# Patient Record
Sex: Male | Born: 1935 | Race: Black or African American | Hispanic: No | Smoking: Never smoker
Health system: Southern US, Community
[De-identification: ages and names within clinical notes are randomized; demographics above are authoritative.]

## PROBLEM LIST (undated history)

## (undated) DIAGNOSIS — F0390 Unspecified dementia without behavioral disturbance: Secondary | ICD-10-CM

## (undated) DIAGNOSIS — I10 Essential (primary) hypertension: Secondary | ICD-10-CM

## (undated) DIAGNOSIS — E7800 Pure hypercholesterolemia, unspecified: Secondary | ICD-10-CM

## (undated) DIAGNOSIS — M1990 Unspecified osteoarthritis, unspecified site: Secondary | ICD-10-CM

## (undated) HISTORY — DX: Unspecified dementia, unspecified severity, without behavioral disturbance, psychotic disturbance, mood disturbance, and anxiety: F03.90

---

## 2013-01-05 ENCOUNTER — Encounter: Payer: Self-pay | Admitting: Podiatry

## 2013-01-05 ENCOUNTER — Ambulatory Visit (INDEPENDENT_AMBULATORY_CARE_PROVIDER_SITE_OTHER): Payer: Medicare Other | Admitting: Podiatry

## 2013-01-05 VITALS — BP 145/66 | HR 76 | Ht 64.0 in | Wt 186.0 lb

## 2013-01-05 DIAGNOSIS — B351 Tinea unguium: Secondary | ICD-10-CM | POA: Insufficient documentation

## 2013-01-05 DIAGNOSIS — M25579 Pain in unspecified ankle and joints of unspecified foot: Secondary | ICD-10-CM | POA: Insufficient documentation

## 2013-01-05 NOTE — Patient Instructions (Addendum)
Debrided all nails and calluses. Return in 3 months or as needed. 

## 2013-01-05 NOTE — Progress Notes (Signed)
Subjective: 79 y.o. year old male accompanied by his wife presents complaining of painful nails. Patient requests toe nails, corns and calluses trimmed.   Review of Systems - General ROS: negative for - chills, fever, night sweats, sleep disturbance, weight gain or weight loss  Objective: Dermatologic: Thick yellow deformed nails x 10.  Plantar callus under both heels.  Vascular: Pedal pulses are not palpable bilateral. Orthopedic: rectus foot without gross deformity bilateral.  Neurologic: All epicritic and tactile sensations grossly intact.  Assessment: Dystrophic mycotic nails x 10.  Treatment: All mycotic nails, corns, calluses debrided.  Return in 3 months or as needed.

## 2013-07-07 ENCOUNTER — Encounter: Payer: Self-pay | Admitting: Podiatry

## 2013-07-07 ENCOUNTER — Ambulatory Visit (INDEPENDENT_AMBULATORY_CARE_PROVIDER_SITE_OTHER): Payer: Medicare Other | Admitting: Podiatry

## 2013-07-07 VITALS — BP 139/63 | HR 75 | Ht 64.0 in | Wt 193.0 lb

## 2013-07-07 DIAGNOSIS — B351 Tinea unguium: Secondary | ICD-10-CM

## 2013-07-07 DIAGNOSIS — M25579 Pain in unspecified ankle and joints of unspecified foot: Secondary | ICD-10-CM

## 2013-07-07 NOTE — Progress Notes (Signed)
Subjective:  78 y.o. year old male accompanied by his wife presents complaining of painful nails. Patient requests toe nails, corns and calluses trimmed.   Review of Systems - General ROS: negative for - chills, fever, night sweats, sleep disturbance, weight gain or weight loss   Objective: Dermatologic: Thick yellow deformed nails x 10.  Plantar callus under both heels.  Vascular: Dorsalis pedis pulses are not palpable bilateral. Both Tibialis posterior arteries are palpable.  No edema or erythema noted. Orthopedic: rectus foot without gross deformity bilateral.  Neurologic: All epicritic and tactile sensations grossly intact.   Assessment:  Dystrophic mycotic nails x 10.   Treatment: All mycotic nails, corns, calluses debrided.  Return in 3 months or as needed.

## 2013-07-07 NOTE — Patient Instructions (Signed)
Seen for hypertrophic nails x 10. No new problems noted. All nails debrided. Return in 3 months or as needed.

## 2014-11-13 ENCOUNTER — Emergency Department (HOSPITAL_BASED_OUTPATIENT_CLINIC_OR_DEPARTMENT_OTHER): Payer: Medicare Other

## 2014-11-13 ENCOUNTER — Emergency Department (HOSPITAL_BASED_OUTPATIENT_CLINIC_OR_DEPARTMENT_OTHER)
Admission: EM | Admit: 2014-11-13 | Discharge: 2014-11-13 | Disposition: A | Discharge status: Other Acute Inpatient Hospital | Payer: Medicare Other | Attending: Emergency Medicine | Admitting: Emergency Medicine

## 2014-11-13 ENCOUNTER — Encounter (HOSPITAL_BASED_OUTPATIENT_CLINIC_OR_DEPARTMENT_OTHER): Payer: Self-pay | Admitting: *Deleted

## 2014-11-13 DIAGNOSIS — R0602 Shortness of breath: Secondary | ICD-10-CM | POA: Insufficient documentation

## 2014-11-13 DIAGNOSIS — R000 Tachycardia, unspecified: Secondary | ICD-10-CM | POA: Diagnosis not present

## 2014-11-13 DIAGNOSIS — Z8739 Personal history of other diseases of the musculoskeletal system and connective tissue: Secondary | ICD-10-CM | POA: Insufficient documentation

## 2014-11-13 DIAGNOSIS — Z79899 Other long term (current) drug therapy: Secondary | ICD-10-CM | POA: Insufficient documentation

## 2014-11-13 DIAGNOSIS — I10 Essential (primary) hypertension: Secondary | ICD-10-CM | POA: Diagnosis not present

## 2014-11-13 DIAGNOSIS — I639 Cerebral infarction, unspecified: Secondary | ICD-10-CM

## 2014-11-13 DIAGNOSIS — R531 Weakness: Secondary | ICD-10-CM | POA: Insufficient documentation

## 2014-11-13 DIAGNOSIS — E780 Pure hypercholesterolemia: Secondary | ICD-10-CM | POA: Insufficient documentation

## 2014-11-13 DIAGNOSIS — F0390 Unspecified dementia without behavioral disturbance: Secondary | ICD-10-CM | POA: Insufficient documentation

## 2014-11-13 HISTORY — DX: Essential (primary) hypertension: I10

## 2014-11-13 HISTORY — DX: Pure hypercholesterolemia, unspecified: E78.00

## 2014-11-13 HISTORY — DX: Unspecified osteoarthritis, unspecified site: M19.90

## 2014-11-13 LAB — COMPREHENSIVE METABOLIC PANEL
ALBUMIN: 3.9 g/dL (ref 3.5–5.2)
ALK PHOS: 68 U/L (ref 39–117)
ALT: 34 U/L (ref 0–53)
ANION GAP: 6 (ref 5–15)
AST: 41 U/L — ABNORMAL HIGH (ref 0–37)
BILIRUBIN TOTAL: 0.6 mg/dL (ref 0.3–1.2)
BUN: 41 mg/dL — AB (ref 6–23)
CO2: 27 mmol/L (ref 19–32)
Calcium: 10.1 mg/dL (ref 8.4–10.5)
Chloride: 109 mmol/L (ref 96–112)
Creatinine, Ser: 1.4 mg/dL — ABNORMAL HIGH (ref 0.50–1.35)
GFR calc Af Amer: 52 mL/min — ABNORMAL LOW (ref >90)
GFR, EST NON AFRICAN AMERICAN: 45 mL/min — AB (ref >90)
GLUCOSE: 158 mg/dL — AB (ref 70–99)
Potassium: 3.8 mmol/L (ref 3.5–5.1)
Sodium: 142 mmol/L (ref 135–145)
Total Protein: 8.5 g/dL — ABNORMAL HIGH (ref 6.0–8.3)

## 2014-11-13 LAB — DIFFERENTIAL
BASOS ABS: 0 10*3/uL (ref 0.0–0.1)
Basophils Relative: 0 % (ref 0–1)
EOS ABS: 0 10*3/uL (ref 0.0–0.7)
Eosinophils Relative: 0 % (ref 0–5)
LYMPHS ABS: 0.7 10*3/uL (ref 0.7–4.0)
Lymphocytes Relative: 5 % — ABNORMAL LOW (ref 12–46)
Monocytes Absolute: 1.5 10*3/uL — ABNORMAL HIGH (ref 0.1–1.0)
Monocytes Relative: 11 % (ref 3–12)
NEUTROS PCT: 84 % — AB (ref 43–77)
Neutro Abs: 11.4 10*3/uL — ABNORMAL HIGH (ref 1.7–7.7)

## 2014-11-13 LAB — CBC
HCT: 43.2 % (ref 39.0–52.0)
Hemoglobin: 14.3 g/dL (ref 13.0–17.0)
MCH: 29.8 pg (ref 26.0–34.0)
MCHC: 33.1 g/dL (ref 30.0–36.0)
MCV: 90 fL (ref 78.0–100.0)
Platelets: 292 10*3/uL (ref 150–400)
RBC: 4.8 MIL/uL (ref 4.22–5.81)
RDW: 14.5 % (ref 11.5–15.5)
WBC: 13.6 10*3/uL — ABNORMAL HIGH (ref 4.0–10.5)

## 2014-11-13 LAB — URINALYSIS, ROUTINE W REFLEX MICROSCOPIC
BILIRUBIN URINE: NEGATIVE
Glucose, UA: NEGATIVE mg/dL
KETONES UR: NEGATIVE mg/dL
LEUKOCYTES UA: NEGATIVE
Nitrite: NEGATIVE
Protein, ur: 30 mg/dL — AB
SPECIFIC GRAVITY, URINE: 1.03 (ref 1.005–1.030)
UROBILINOGEN UA: 1 mg/dL (ref 0.0–1.0)
pH: 5.5 (ref 5.0–8.0)

## 2014-11-13 LAB — URINE MICROSCOPIC-ADD ON

## 2014-11-13 LAB — PROTIME-INR
INR: 1.09 (ref 0.00–1.49)
PROTHROMBIN TIME: 14.1 s (ref 11.6–15.2)

## 2014-11-13 LAB — CBG MONITORING, ED: Glucose-Capillary: 126 mg/dL — ABNORMAL HIGH (ref 70–99)

## 2014-11-13 LAB — RAPID URINE DRUG SCREEN, HOSP PERFORMED
Amphetamines: NOT DETECTED
Barbiturates: NOT DETECTED
Benzodiazepines: NOT DETECTED
Cocaine: NOT DETECTED
OPIATES: NOT DETECTED
Tetrahydrocannabinol: NOT DETECTED

## 2014-11-13 LAB — ETHANOL

## 2014-11-13 LAB — APTT: aPTT: 30 seconds (ref 24–37)

## 2014-11-13 LAB — TROPONIN I: Troponin I: 0.06 ng/mL — ABNORMAL HIGH (ref <0.031)

## 2014-11-13 MED ORDER — LORAZEPAM 2 MG/ML IJ SOLN
1.0000 mg | Freq: Once | INTRAMUSCULAR | Status: AC
  Administered 2014-11-13: 1 mg via INTRAVENOUS

## 2014-11-13 MED ORDER — LORAZEPAM 2 MG/ML IJ SOLN
INTRAMUSCULAR | Status: AC
  Filled 2014-11-13: qty 1

## 2014-11-13 MED ORDER — SODIUM CHLORIDE 0.9 % IV BOLUS (SEPSIS)
1000.0000 mL | Freq: Once | INTRAVENOUS | Status: AC
  Administered 2014-11-13: 1000 mL via INTRAVENOUS

## 2014-11-13 MED ORDER — ASPIRIN 300 MG RE SUPP
300.0000 mg | Freq: Once | RECTAL | Status: AC
  Administered 2014-11-13: 300 mg via RECTAL
  Filled 2014-11-13: qty 1

## 2014-11-13 MED ORDER — IOHEXOL 350 MG/ML SOLN
100.0000 mL | Freq: Once | INTRAVENOUS | Status: DC | PRN
Start: 2014-11-13 — End: 2014-11-14

## 2014-11-13 NOTE — ED Notes (Signed)
Pt was last seen at complete baseline Wed. Night. Daughter states that Thur morning she noticed he was very weak, the family called EMS and that they told the family his vital signs were fine and the daughter chose not to have him brought to the ER. Friday morning  they put him in a chair and fed him, reports pt was speaking more legibly at that time. States that Friday and today he has been mostly sleeping and not very responsive, although he ate this morning without difficulty according to the daughter. Patient came into the ER in a wheelchair with obvious complete right sided droop/weakness. Patient states that he has had the "hiccups" since Thursday as well.

## 2014-11-13 NOTE — ED Provider Notes (Addendum)
CSN: [id number]     Arrival date & time 11/13/14  1605 History  This chart was scribed for Jack Canal, MD by Modena Jansky, ED Scribe. This patient was seen in room MH01/MH01 and the patient's care was started at 4:26 PM.   Chief Complaint  Patient presents with  . Weakness   The history is provided by a relative. No language interpreter was used.    HPI Comments: Jack Harper is a 81 y.o. male who presents to the Emergency Department complaining of constant moderate weakness that started about 2 days ago. Relative states that pt had trouble ambulating due to weakness 2 days ago. She states that she called EMS at the time, but she decided not take him to the ED because his vital signs were stable. She reports that since then pt has had decreased responsiveness to commands, but can still speak. She states that pt has been sleeping and having hiccups more often.   Level V caveat- AMS   Past Medical History  Diagnosis Date  . Dementia   . Hypertension   . Arthritis   . Hypercholesteremia    History reviewed. No pertinent past surgical history. No family history on file. History  Substance Use Topics  . Smoking status: Never Smoker   . Smokeless tobacco: Never Used  . Alcohol Use: Not on file    Review of Systems  Constitutional: Positive for fatigue.  Neurological: Positive for weakness. Negative for speech difficulty.  All other systems reviewed and are negative.   Allergies  Review of patient's allergies indicates no known allergies.  Home Medications   Prior to Admission medications   Medication Sig Start Date End Date Taking? Authorizing Provider  allopurinol (ZYLOPRIM) 100 MG tablet Take 100 mg by mouth daily.  12/01/12   Historical Provider, MD  atorvastatin (LIPITOR) 40 MG tablet Take 100 mg by mouth daily.  12/01/12   Historical Provider, MD  Multiple Vitamins-Minerals (MULTIVITAMIN WITH MINERALS) tablet Take 1 tablet by mouth daily.    Historical Provider, MD   NAMENDA 10 MG tablet Take 10 mg by mouth 2 (two) times daily.  12/23/12   Historical Provider, MD  olmesartan-hydrochlorothiazide (BENICAR HCT) 40-12.5 MG per tablet Take 1 tablet by mouth daily.    Historical Provider, MD  Vitamin D, Ergocalciferol, (DRISDOL) 50000 UNITS CAPS Take 50,000 Units by mouth every 7 (seven) days.    Historical Provider, MD   BP 136/93 mmHg  Pulse 127  Resp 30  SpO2 100% Physical Exam  Constitutional: He is oriented to person, place, and time. He appears well-developed and well-nourished. No distress.  Chronically ill, lethargic.   HENT:  Head: Normocephalic and atraumatic.  Eyes:  2 mm pupils.   Neck: Normal range of motion. No tracheal deviation present.  Cardiovascular: Regular rhythm.  Exam reveals no gallop.   No murmur heard. Tachycardic.   Pulmonary/Chest: Effort normal and breath sounds normal. No respiratory distress. He has no wheezes. He has no rales.  Abdominal: Soft. He exhibits distension. There is no tenderness.  Abdomen is slightly distended.   Musculoskeletal: Normal range of motion.  Neurological: He is alert and oriented to person, place, and time.  Diminished strength throughout, worse on right. No posturing. No obvious facial droop.   Skin: Skin is warm and dry.  Psychiatric: He has a normal mood and affect. His behavior is normal.  Vitals reviewed.   ED Course  Procedures (including critical care time) DIAGNOSTIC STUDIES: Oxygen Saturation is 100% on  RA, normal by my interpretation.    COORDINATION OF CARE: 4:30 PM- Pt advised of plan for treatment which includes radiology and labs and pt agrees.  CRITICAL CARE Performed by: Silverio Lay, DAVID   Total critical care time: 30 min   Critical care time was exclusive of separately billable procedures and treating other patients.  Critical care was necessary to treat or prevent imminent or life-threatening deterioration.  Critical care was time spent personally by me on the following  activities: development of treatment plan with patient and/or surrogate as well as nursing, discussions with consultants, evaluation of patient's response to treatment, examination of patient, obtaining history from patient or surrogate, ordering and performing treatments and interventions, ordering and review of laboratory studies, ordering and review of radiographic studies, pulse oximetry and re-evaluation of patient's condition.   Labs Review Labs Reviewed  CBC - Abnormal; Notable for the following:    WBC 13.6 (*)    All other components within normal limits  DIFFERENTIAL - Abnormal; Notable for the following:    Neutrophils Relative % 84 (*)    Neutro Abs 11.4 (*)    Lymphocytes Relative 5 (*)    Monocytes Absolute 1.5 (*)    All other components within normal limits  COMPREHENSIVE METABOLIC PANEL - Abnormal; Notable for the following:    Glucose, Bld 158 (*)    BUN 41 (*)    Creatinine, Ser 1.40 (*)    Total Protein 8.5 (*)    AST 41 (*)    GFR calc non Af Amer 45 (*)    GFR calc Af Amer 52 (*)    All other components within normal limits  URINALYSIS, ROUTINE W REFLEX MICROSCOPIC - Abnormal; Notable for the following:    Hgb urine dipstick SMALL (*)    Protein, ur 30 (*)    All other components within normal limits  TROPONIN I - Abnormal; Notable for the following:    Troponin I 0.06 (*)    All other components within normal limits  URINE MICROSCOPIC-ADD ON - Abnormal; Notable for the following:    Bacteria, UA MANY (*)    Casts HYALINE CASTS (*)    All other components within normal limits  CBG MONITORING, ED - Abnormal; Notable for the following:    Glucose-Capillary 126 (*)    All other components within normal limits  ETHANOL  PROTIME-INR  APTT  URINE RAPID DRUG SCREEN (HOSP PERFORMED)    Imaging Review Dg Chest 1 View  11/13/2014   CLINICAL DATA:  Weakness, right-sided through  EXAM: CHEST  1 VIEW  COMPARISON:  None.  FINDINGS: Normal cardiac silhouette with  ectatic aorta. Patient is rotated rightward. The basilar atelectasis. No effusion, infiltrate, or pneumothorax.  IMPRESSION: No acute cardiopulmonary process.   Electronically Signed   By: Genevive Bi M.D.   On: 11/13/2014 17:32   Ct Head Wo Contrast  11/13/2014   CLINICAL DATA:  Code stroke.  Unresponsive.  Weakness.  EXAM: CT HEAD WITHOUT CONTRAST  TECHNIQUE: Contiguous axial images were obtained from the base of the skull through the vertex without intravenous contrast.  COMPARISON:  None.  FINDINGS: There is atrophy and chronic small vessel disease changes. Associated ventriculomegaly. No acute intracranial abnormality. Specifically, no hemorrhage, hydrocephalus, mass lesion, acute infarction, or significant intracranial injury. No acute calvarial abnormality. Visualized paranasal sinuses and mastoids clear. Orbital soft tissues unremarkable.  IMPRESSION: No acute intracranial abnormality.  Atrophy, chronic microvascular disease.  Critical Value/emergent results were called by telephone at  the time of interpretation on 11/13/2014 at 4:51 pm to Dr. Chaney MallingAVID YAO , who verbally acknowledged these results.   Electronically Signed   By: Charlett NoseKevin  Dover M.D.   On: 11/13/2014 16:51     EKG Interpretation   Date/Time:  Saturday November 13 2014 16:19:23 EST Ventricular Rate:  125 PR Interval:  132 QRS Duration: 126 QT Interval:  322 QTC Calculation: 464 R Axis:   -42 Text Interpretation:  Sinus tachycardia with Premature atrial complexes  Left axis deviation Right bundle branch block Abnormal ECG No previous  ECGs available Confirmed by YAO  MD, DAVID ([id number]) on 11/13/2014 4:25:58 PM      MDM   Final diagnoses:  Weakness  Shortness of breath  Stroke  Stroke   Jamelle RushingHerbert Schembri is a 81 y.o. male here with AMS, diffuse weakness, lethargy. Patient difficult to arouse on exam. Protecting his airway currently. I am concerned for possible stroke vs MI vs infection.   8:07 PM WBC 14. UA and CXR  unremarkable. Trop mildly elevated. CT head unremarkable. I don't know why he is altered. I discussed with Dr. Thad Rangereynolds from neurology, who feels that if he is altered for several days from a stroke, CT head would show something more. I consider PE given tachycardia but patient unable to stay still for CT despite ativan. Given ASA for elevated troponin. Consider MI or unstable angina as possible cause. However, in the setting of possible ischemic stroke, I held heparin because the risk of intra cranial hemorrhage. Will admit to [facility] for further workup and stabilization.    I personally performed the services described in this documentation, which was scribed in my presence. The recorded information has been reviewed and is accurate.    Jack Canalavid H Yao, MD 11/13/14 2010  Jack Canalavid H Yao, MD 11/14/14 1351

## 2015-02-16 DEATH — deceased

## 2016-10-31 IMAGING — CT CT HEAD W/O CM
1 series · 16 of 30 positions shown, 20 images · non-contrast
Comparison: None.

CLINICAL DATA: Code stroke.  Unresponsive.  Weakness.

EXAM:
CT HEAD WITHOUT CONTRAST
TECHNIQUE: Contiguous axial images were obtained from the base of the skull
through the vertex without intravenous contrast.

[Series 3: head 4.8 h37s · axial · 0.47mm/px · z∈[+6,+146]mm · 16 of 32 slices shown, 20 images]
[im 2/32  brain]
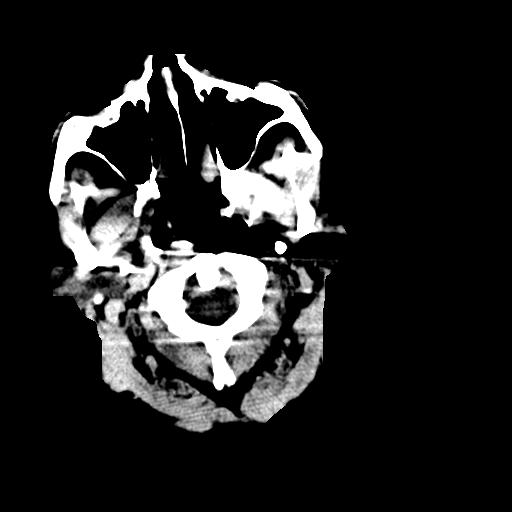
[im 2/32  bone]
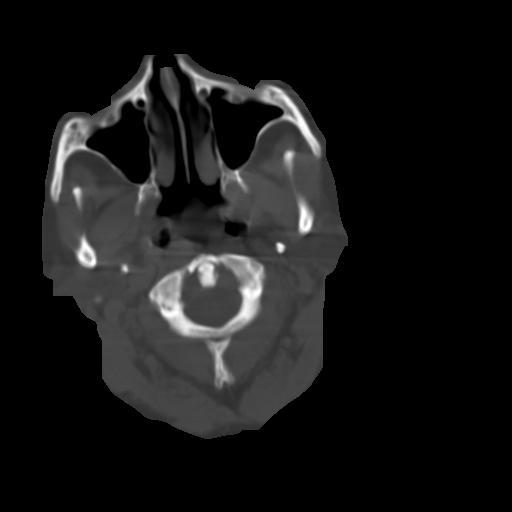
[im 4/32  brain]
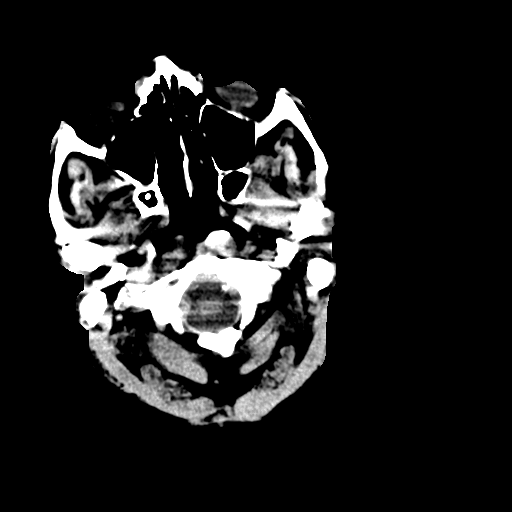
[im 6/32  brain]
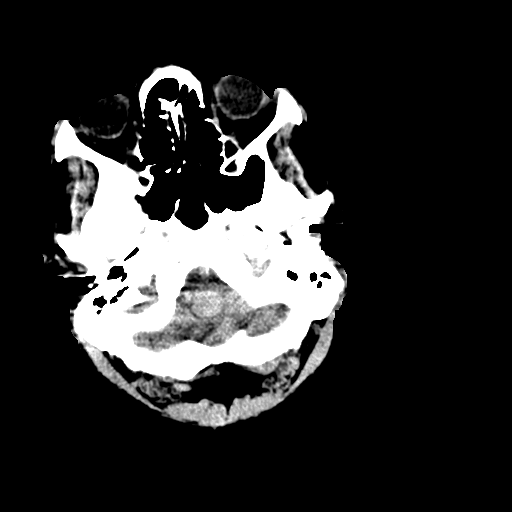
[im 8/32  brain]
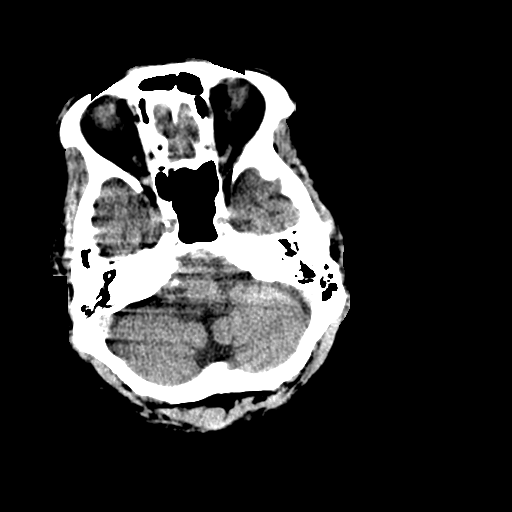
[im 9/32  brain]
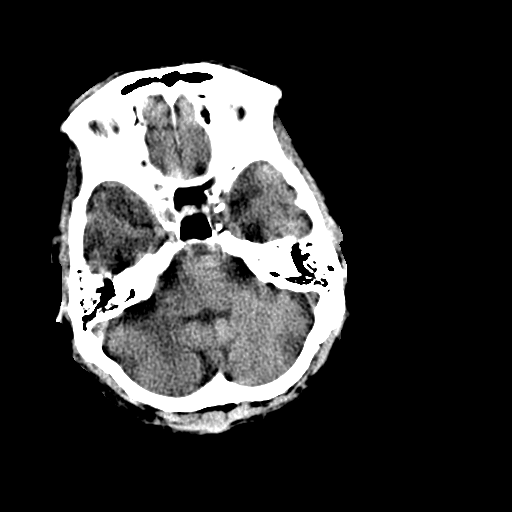
[im 9/32  bone]
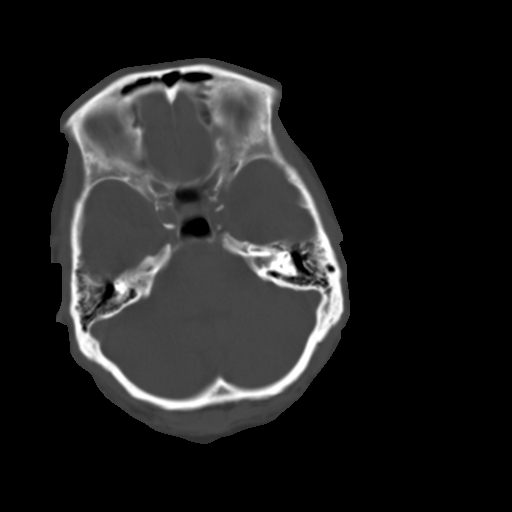
[im 11/32  brain]
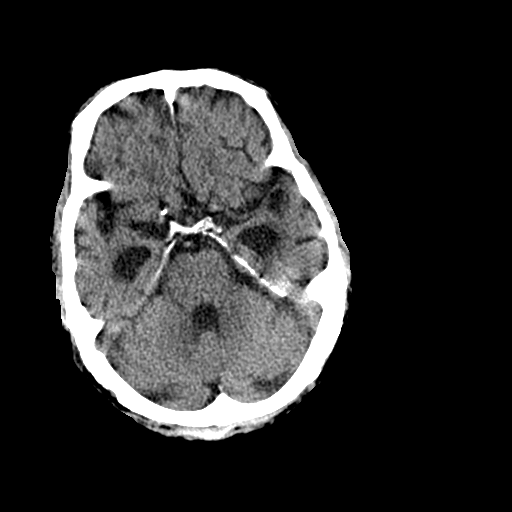
[im 13/32  brain]
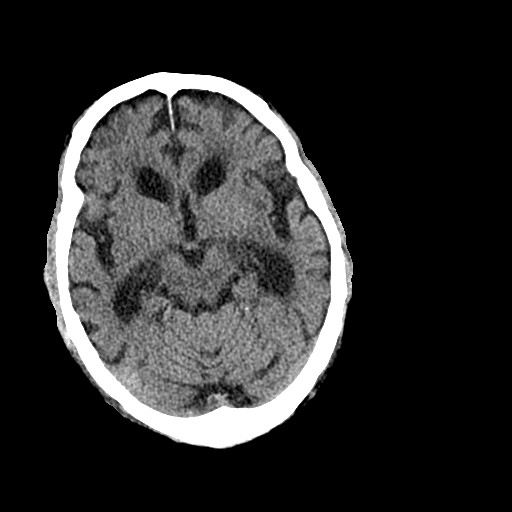
[im 15/32  brain]
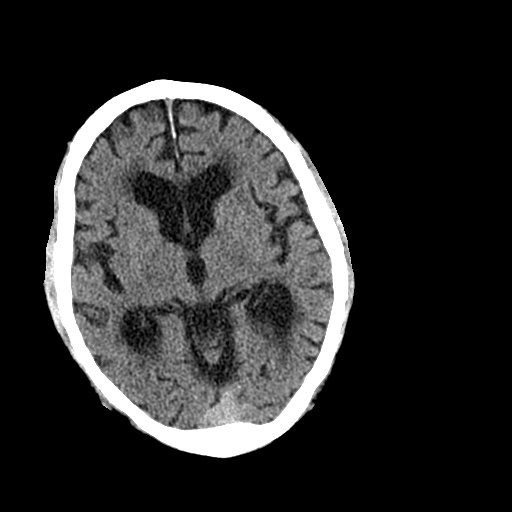
[im 17/32  brain]
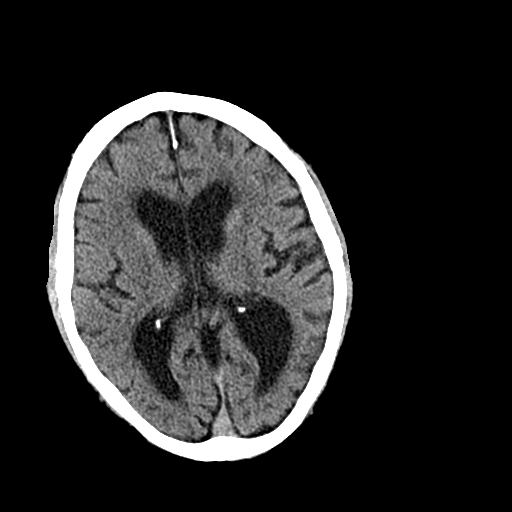
[im 17/32  bone]
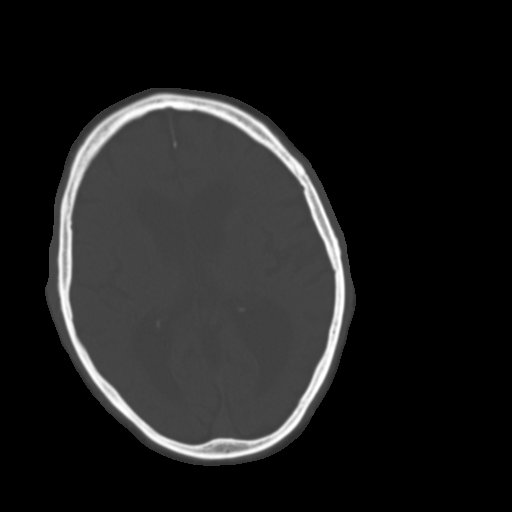
[im 19/32  brain]
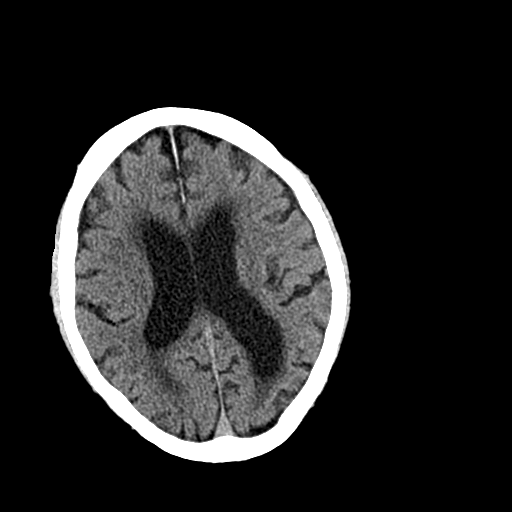
[im 21/32  brain]
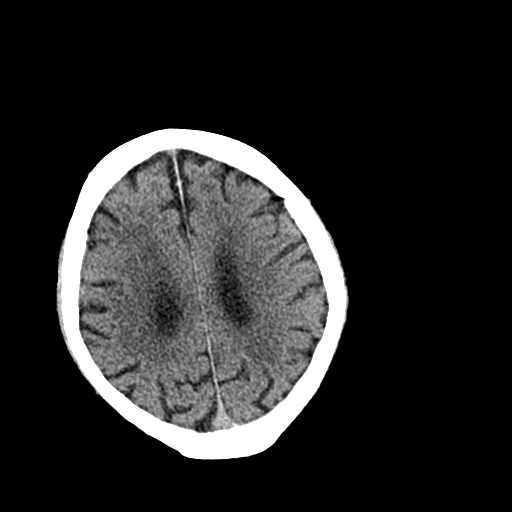
[im 23/32  brain]
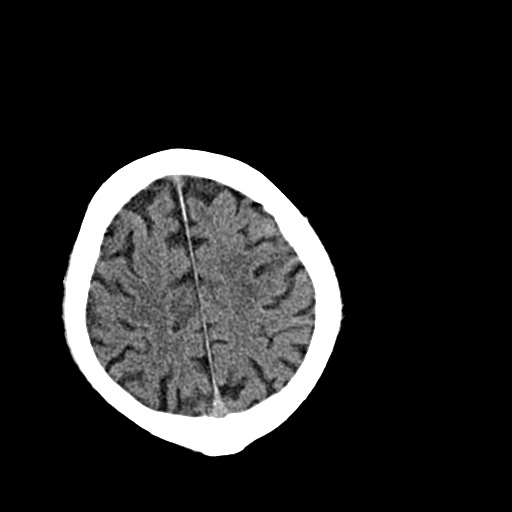
[im 24/32  brain]
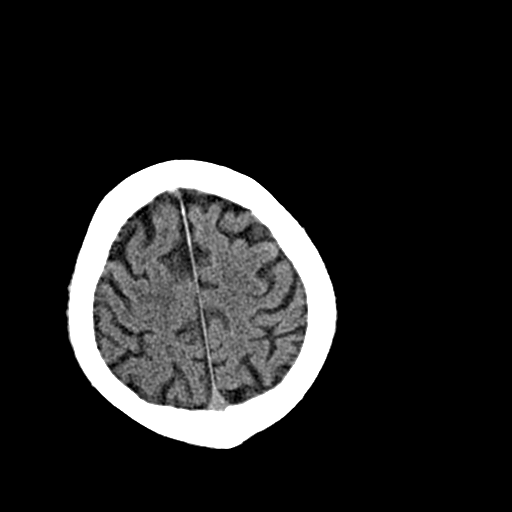
[im 24/32  bone]
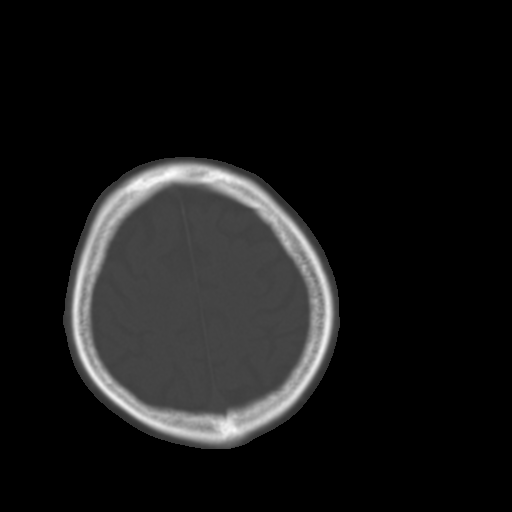
[im 26/32  brain]
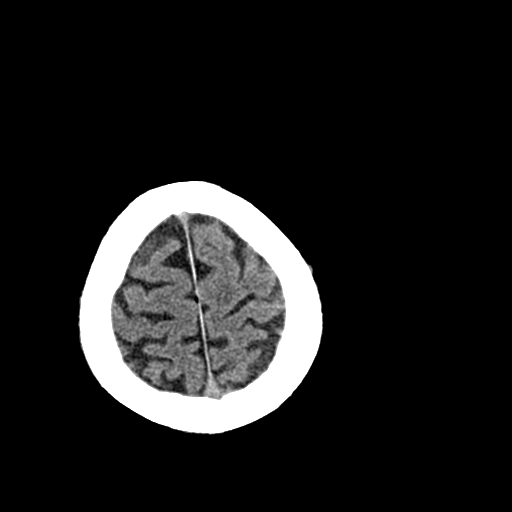
[im 28/32  brain]
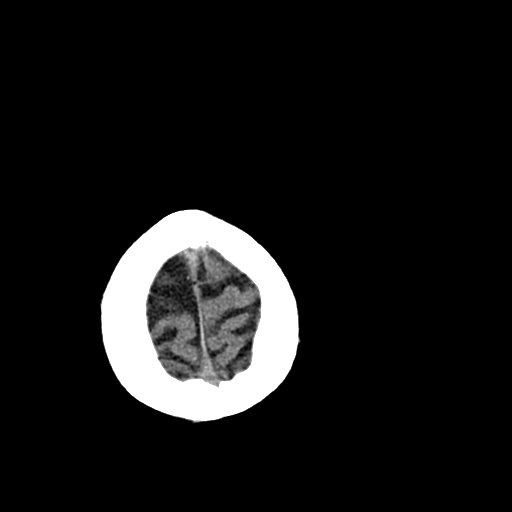
[im 30/32  brain]
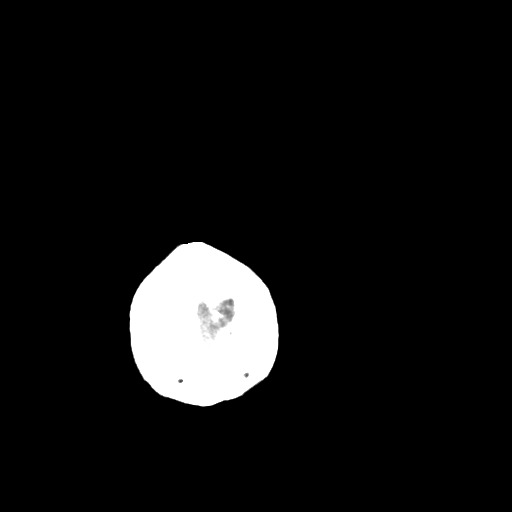

[16 of 30 positions shown; findings below may reference images not displayed]

FINDINGS: There is atrophy and chronic small vessel disease changes.
Associated ventriculomegaly. No acute intracranial abnormality.
Specifically, no hemorrhage, hydrocephalus, mass lesion, acute
infarction, or significant intracranial injury. No acute calvarial
abnormality. Visualized paranasal sinuses and mastoids clear.
Orbital soft tissues unremarkable.
IMPRESSION: No acute intracranial abnormality.

Atrophy, chronic microvascular disease.

Critical Value/emergent results were called by telephone at the time
of interpretation on 11/13/2014 at [DATE] to Dr. PAPIK NOBOA , who
verbally acknowledged these results.
